# Patient Record
Sex: Male | Born: 2000 | Race: Black or African American | Hispanic: No | Marital: Single | Smoking: Never smoker
Health system: Southern US, Community
[De-identification: ages and names within clinical notes are randomized; demographics above are authoritative.]

## PROBLEM LIST (undated history)

## (undated) DIAGNOSIS — J302 Other seasonal allergic rhinitis: Secondary | ICD-10-CM

## (undated) HISTORY — PX: EYE SURGERY: SHX253

---
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
## 2023-06-19 ENCOUNTER — Emergency Department (HOSPITAL_BASED_OUTPATIENT_CLINIC_OR_DEPARTMENT_OTHER)
Admission: EM | Admit: 2023-06-19 | Discharge: 2023-06-20 | Disposition: A | Discharge status: Home / Self Care | Payer: BC Managed Care – PPO | Attending: Emergency Medicine | Admitting: Emergency Medicine

## 2023-06-19 ENCOUNTER — Encounter (HOSPITAL_BASED_OUTPATIENT_CLINIC_OR_DEPARTMENT_OTHER): Payer: Self-pay | Admitting: Emergency Medicine

## 2023-06-19 ENCOUNTER — Other Ambulatory Visit: Payer: Self-pay

## 2023-06-19 DIAGNOSIS — H5711 Ocular pain, right eye: Secondary | ICD-10-CM | POA: Diagnosis present

## 2023-06-19 DIAGNOSIS — S0501XA Injury of conjunctiva and corneal abrasion without foreign body, right eye, initial encounter: Secondary | ICD-10-CM | POA: Insufficient documentation

## 2023-06-19 DIAGNOSIS — W228XXA Striking against or struck by other objects, initial encounter: Secondary | ICD-10-CM | POA: Insufficient documentation

## 2023-06-19 DIAGNOSIS — Y9367 Activity, basketball: Secondary | ICD-10-CM | POA: Insufficient documentation

## 2023-06-19 NOTE — ED Provider Notes (Signed)
  [place] [facility] Provider Note   CSN: [id number] Arrival date & time: 06/19/23  2251     History {Add pertinent medical, surgical, social history, OB history to HPI:1} Chief Complaint  Patient presents with   Eye Pain    Chad Brewer is a 22 y.o. male.   Eye Pain  Chad Brewer is a 22 y.o. male who presents to the Emergency Department complaining of *** Shoulder to eye at 6p Pain with moving eye, shock and scratchy, watery.       Home Medications Prior to Admission medications   Medication Sig Start Date End Date Taking? Authorizing Provider  cetirizine (ZYRTEC) 10 MG tablet Take 10 mg by mouth daily.    [provider]  erythromycin [facility]) ophthalmic ointment Place 1 application into the left eye 4 (four) times daily. Use for 5 days. Patient not taking: Reported on 07/02/2019 02/03/14   Garnetta Buddy, MD  fluticasone-salmeterol (ADVAIR [facility]) [phone] MCG/ACT inhaler Inhale 2 puffs into the lungs 2 (two) times daily.    [provider]  loratadine (CLARITIN) 10 MG tablet Take 10 mg by mouth daily.    [provider]      Allergies    Patient has no known allergies.    Review of Systems   Review of Systems  Eyes:  Positive for pain.    Physical Exam Updated Vital Signs BP (!) 148/70   Pulse 76   Temp 98.5 F (36.9 C) (Oral)   Resp 16   SpO2 100%  Physical Exam  ED Results / Procedures / Treatments   Labs (all labs ordered are listed, but only abnormal results are displayed) Labs Reviewed - No data to display  EKG None  Radiology No results found.  Procedures Procedures  {Document cardiac monitor, telemetry assessment procedure when appropriate:1}  Medications Ordered in ED Medications - No data to display  ED Course/ Medical Decision Making/ A&P   {   Click here for ABCD2, HEART and other calculatorsREFRESH Note before signing :1}                               Medical Decision Making  ***  {Document critical care time when appropriate:1} {Document review of labs and clinical decision tools ie heart score, Chads2Vasc2 etc:1}  {Document your independent review of radiology images, and any outside records:1} {Document your discussion with family members, caretakers, and with consultants:1} {Document social determinants of health affecting pt's care:1} {Document your decision making why or why not admission, treatments were needed:1} Final Clinical Impression(s) / ED Diagnoses Final diagnoses:  None    Rx / DC Orders ED Discharge Orders     None

## 2023-06-19 NOTE — ED Triage Notes (Signed)
Patient states he was playing basketball and got hit in the right eye with another players shoulder.  Patient having pain trying to open his right eye.  Eyeball is red, as if it was irritated.

## 2023-06-20 DIAGNOSIS — S0501XA Injury of conjunctiva and corneal abrasion without foreign body, right eye, initial encounter: Secondary | ICD-10-CM | POA: Diagnosis not present

## 2023-06-20 MED ORDER — IBUPROFEN 400 MG PO TABS
600.0000 mg | ORAL_TABLET | Freq: Once | ORAL | Status: AC
  Administered 2023-06-20: 600 mg via ORAL
  Filled 2023-06-20: qty 1

## 2023-06-20 MED ORDER — TETRACAINE HCL 0.5 % OP SOLN
1.0000 [drp] | Freq: Once | OPHTHALMIC | Status: AC
  Administered 2023-06-20: 1 [drp] via OPHTHALMIC
  Filled 2023-06-20: qty 4

## 2023-06-20 MED ORDER — FLUORESCEIN SODIUM 1 MG OP STRP
1.0000 | ORAL_STRIP | Freq: Once | OPHTHALMIC | Status: AC
  Administered 2023-06-20: 1 via OPHTHALMIC
  Filled 2023-06-20: qty 1

## 2023-06-20 MED ORDER — ERYTHROMYCIN 5 MG/GM OP OINT
TOPICAL_OINTMENT | Freq: Once | OPHTHALMIC | Status: AC
  Administered 2023-06-20: 1 via OPHTHALMIC
  Filled 2023-06-20: qty 3.5

## 2023-06-20 NOTE — Discharge Instructions (Signed)
You can use the erythromycin ointment four times daily.
